# Patient Record
Sex: Male | Born: 1980 | Race: Black or African American | Hispanic: No | Marital: Single | Smoking: Current every day smoker
Health system: Southern US, Community
[De-identification: ages and names within clinical notes are randomized; demographics above are authoritative.]

## PROBLEM LIST (undated history)

## (undated) DIAGNOSIS — I10 Essential (primary) hypertension: Secondary | ICD-10-CM

---

## 2015-10-01 ENCOUNTER — Emergency Department (HOSPITAL_COMMUNITY)
Admission: EM | Admit: 2015-10-01 | Discharge: 2015-10-01 | Disposition: A | Discharge status: Home / Self Care | Payer: Self-pay | Attending: Emergency Medicine | Admitting: Emergency Medicine

## 2015-10-01 ENCOUNTER — Encounter (HOSPITAL_COMMUNITY): Payer: Self-pay

## 2015-10-01 DIAGNOSIS — Z720 Tobacco use: Secondary | ICD-10-CM | POA: Insufficient documentation

## 2015-10-01 DIAGNOSIS — I10 Essential (primary) hypertension: Secondary | ICD-10-CM | POA: Insufficient documentation

## 2015-10-01 DIAGNOSIS — G4489 Other headache syndrome: Secondary | ICD-10-CM | POA: Insufficient documentation

## 2015-10-01 DIAGNOSIS — R112 Nausea with vomiting, unspecified: Secondary | ICD-10-CM

## 2015-10-01 MED ORDER — KETOROLAC TROMETHAMINE 60 MG/2ML IM SOLN
60.0000 mg | Freq: Once | INTRAMUSCULAR | Status: AC
  Administered 2015-10-01: 60 mg via INTRAMUSCULAR
  Filled 2015-10-01: qty 2

## 2015-10-01 MED ORDER — BUTALBITAL-APAP-CAFFEINE 50-325-40 MG PO TABS: 1.0000 | ORAL_TABLET | Freq: Four times a day (QID) | ORAL | Status: AC | PRN

## 2015-10-01 NOTE — ED Notes (Signed)
Pt hollaring out loud.  Going to check on pt.  Pt states he is frustrated and tired of waiting.

## 2015-10-01 NOTE — ED Notes (Signed)
Pt reports headache and vomiting since Monday.

## 2015-10-01 NOTE — ED Notes (Signed)
Pt turned monitor off, stated it was getting on his nerves. Pt states he was tired of waiting and he was going to leave.  Explained to pt that both doctors were busy and one will be in as quick as they could. Family member states she is trying to keep him calm and wants to know if I can give him something to calm him down. Family made aware that I could give an medication without a doctors order

## 2015-10-01 NOTE — Discharge Instructions (Signed)
YOU MUST HAVE YOUR BLOOD PRESSURE RECHECKED IN NEXT 2 WEEKS  YOU MAY NEED TO START BLOOD PRESSURE MEDICATIONS IF IT STAYS ELEVATED  Please obtain all of your results from medical records or have your doctors office obtain the results - share them with your doctor - you should be seen at your doctors office in the next 2 days. Call today to arrange your follow up. Take the medications as prescribed. Please review all of the medicines and only take them if you do not have an allergy to them. Please be aware that if you are taking birth control pills, taking other prescriptions, ESPECIALLY ANTIBIOTICS may make the birth control ineffective - if this is the case, either do not engage in sexual activity or use alternative methods of birth control such as condoms until you have finished the medicine and your family doctor says it is OK to restart them. If you are on a blood thinner such as COUMADIN, be aware that any other medicine that you take may cause the coumadin to either work too much, or not enough - you should have your coumadin level rechecked in next 7 days if this is the case.  ?  It is also a possibility that you have an allergic reaction to any of the medicines that you have been prescribed - Everybody reacts differently to medications and while MOST people have no trouble with most medicines, you may have a reaction such as nausea, vomiting, rash, swelling, shortness of breath. If this is the case, please stop taking the medicine immediately and contact your physician.  ?  You should return to the ER if you develop severe or worsening symptoms.   [facility] [place] [facility]    Kari BaarsEdward Hawkins MD. Specialty: Pulmonary Disease Contact information: [address]  [address]  [place] [address]  [phone]   Syliva OvermanMargaret Simpson, MD. [facility]ty: [facility] Contact information: [address]  [place] [address]  [phone]   Lilyan PuntScott Luking, MD. Specialty:  [facility] Medicine Contact information: [address]  [address]  [place] [address]  [phone]   Avon Gullyesfaye Fanta, MD Specialty: Internal Medicine Contact information: [address]  [place] [address]  [phone]   Catalina PizzaZach Hall, MD. Specialty: Internal Medicine Contact information: [address]  [place] [address]  [phone]   Butch PennyAngus Mcinnis, MD. Specialty: Family Medicine Contact information: [address]  [place] [address]  [phone]   John GiovanniStephen Knowlton, MD. Specialty: [facility] Contact information: [address]  [address]  [place] [address]  [phone]   Carylon Perchesoy Fagan, MD. Specialty: Internal Medicine Contact information: [address]  [address]  [place] [address]  [phone]

## 2015-10-01 NOTE — ED Provider Notes (Signed)
CSN: [id number]     Arrival date & time 10/01/15  1542 History   First MD Initiated Contact with Patient 10/01/15 1550     Chief Complaint  Patient presents with  . Headache  . Emesis     (Consider location/radiation/quality/duration/timing/severity/associated sxs/prior Treatment) HPI  The patient is a 34 year old male, he has no history of headaches, no history of any significant medical problems, takes no medications. He has been having increasing headaches over the last 3 days. He recently started a new job at work, he is working with [employer] and drills. When he gets home he has a headache that is made worse with loud sounds, not affected by bright lights. It is intermittent, he states that right now the headache is not that bad. It is bitemporal, throbbing, not associated with fevers chills, shortness of breath, chest pain, stiff neck, blurred vision or any other neurologic complaints. He has had 2 episodes of nausea and vomiting today but also states that he has had some watery diarrhea today as well.  History reviewed. No pertinent past medical history. History reviewed. No pertinent past surgical history. No family history on file. Social History  Substance Use Topics  . Smoking status: Current Every Day Smoker  . Smokeless tobacco: None  . Alcohol Use: Yes     Comment: daily    Review of Systems  All other systems reviewed and are negative.     Allergies  Review of patient's allergies indicates no known allergies.  Home Medications   Prior to Admission medications   Medication Sig Start Date End Date Taking? Authorizing Provider  acetaminophen (TYLENOL) 500 MG tablet Take 500 mg by mouth every 6 (six) hours as needed for mild pain, moderate pain or headache.   Yes Historical Provider, MD  aspirin-acetaminophen-caffeine (EXCEDRIN MIGRAINE) [id number] MG tablet Take 2 tablets by mouth daily as needed for headache.   Yes Historical Provider, MD  ibuprofen  (ADVIL,MOTRIN) 200 MG tablet Take 200 mg by mouth every 6 (six) hours as needed for fever, headache or mild pain.   Yes Historical Provider, MD  butalbital-acetaminophen-caffeine (FIORICET) 50-325-40 MG tablet Take 1-2 tablets by mouth every 6 (six) hours as needed for headache. 10/01/15 09/30/16  Eber HongBrian Oluwadarasimi Redmon, MD   BP 155/108 mmHg  Pulse 96  Temp(Src) 98.1 F (36.7 C) (Oral)  Resp 18  Ht 6\' 2"  (1.88 m)  Wt 170 lb (77.111 kg)  BMI 21.82 kg/m2  SpO2 99% Physical Exam  Constitutional: He appears well-developed and well-nourished. No distress.  HENT:  Head: Normocephalic and atraumatic.  Mouth/Throat: Oropharynx is clear and moist. No oropharyngeal exudate.  Eyes: Conjunctivae and EOM are normal. Pupils are equal, round, and reactive to light. Right eye exhibits no discharge. Left eye exhibits no discharge. No scleral icterus.  Neck: Normal range of motion. Neck supple. No JVD present. No thyromegaly present.  Cardiovascular: Normal rate, regular rhythm, normal heart sounds and intact distal pulses.  Exam reveals no gallop and no friction rub.   No murmur heard. Pulmonary/Chest: Effort normal and breath sounds normal. No respiratory distress. He has no wheezes. He has no rales.  Abdominal: Soft. Bowel sounds are normal. He exhibits no distension and no mass. There is no tenderness.  Musculoskeletal: Normal range of motion. He exhibits no edema or tenderness.  Lymphadenopathy:    He has no cervical adenopathy.  Neurological: He is alert. Coordination normal.  Speech is clear, cranial nerves III through XII are intact, memory is intact, strength is  normal in all 4 extremities including grips, sensation is intact to light touch and pinprick in all 4 extremities. Coordination as tested by finger-nose-finger is normal, no limb ataxia. Normal gait, normal reflexes at the patellar tendons bilaterally  Skin: Skin is warm and dry. No rash noted. No erythema.  Psychiatric: He has a normal mood and  affect. His behavior is normal.  Nursing note and vitals reviewed.   ED Course  Procedures (including critical care time) Labs Review Labs Reviewed - No data to display  Imaging Review No results found. I have personally reviewed and evaluated these images and lab results as part of my medical decision-making.    MDM   Final diagnoses:  Other headache syndrome  Non-intractable vomiting with nausea, vomiting of unspecified type  Essential hypertension    The patient appears well, his exam is normal except for his blood pressure which is elevated. Will obtain a repeat blood pressure check after pain medications have been given. He does not need a CT scan of the brain at this time as he has had no focal neurologic deficits and seems to have a waxing and waning headache related to loud sounds. The patient is in agreement with this plan.  BP improved - needs close f/u - meds given, will avoid ibuprofen due to htn, fioricet Rx given.  Meds given in ED:  Medications  ketorolac (TORADOL) injection 60 mg (60 mg Intramuscular Given 10/01/15 1612)    New Prescriptions   BUTALBITAL-ACETAMINOPHEN-CAFFEINE (FIORICET) 50-325-40 MG TABLET    Take 1-2 tablets by mouth every 6 (six) hours as needed for headache.        Eber Hong, MD 10/01/15 1723

## 2015-12-09 ENCOUNTER — Emergency Department (HOSPITAL_COMMUNITY)
Admission: EM | Admit: 2015-12-09 | Discharge: 2015-12-09 | Disposition: A | Discharge status: Home / Self Care | Payer: Self-pay | Attending: Emergency Medicine | Admitting: Emergency Medicine

## 2015-12-09 ENCOUNTER — Emergency Department (HOSPITAL_COMMUNITY): Payer: Self-pay

## 2015-12-09 ENCOUNTER — Encounter (HOSPITAL_COMMUNITY): Payer: Self-pay | Admitting: Emergency Medicine

## 2015-12-09 DIAGNOSIS — I10 Essential (primary) hypertension: Secondary | ICD-10-CM | POA: Insufficient documentation

## 2015-12-09 DIAGNOSIS — S92301A Fracture of unspecified metatarsal bone(s), right foot, initial encounter for closed fracture: Secondary | ICD-10-CM

## 2015-12-09 DIAGNOSIS — F17200 Nicotine dependence, unspecified, uncomplicated: Secondary | ICD-10-CM | POA: Insufficient documentation

## 2015-12-09 DIAGNOSIS — Y9389 Activity, other specified: Secondary | ICD-10-CM | POA: Insufficient documentation

## 2015-12-09 DIAGNOSIS — S92334A Nondisplaced fracture of third metatarsal bone, right foot, initial encounter for closed fracture: Secondary | ICD-10-CM | POA: Insufficient documentation

## 2015-12-09 DIAGNOSIS — S92324A Nondisplaced fracture of second metatarsal bone, right foot, initial encounter for closed fracture: Secondary | ICD-10-CM | POA: Insufficient documentation

## 2015-12-09 DIAGNOSIS — W3189XA Contact with other specified machinery, initial encounter: Secondary | ICD-10-CM | POA: Insufficient documentation

## 2015-12-09 DIAGNOSIS — Y998 Other external cause status: Secondary | ICD-10-CM | POA: Insufficient documentation

## 2015-12-09 DIAGNOSIS — Y9289 Other specified places as the place of occurrence of the external cause: Secondary | ICD-10-CM | POA: Insufficient documentation

## 2015-12-09 MED ORDER — HYDROCODONE-ACETAMINOPHEN 5-325 MG PO TABS: 2.0000 | ORAL_TABLET | ORAL | Status: DC | PRN

## 2015-12-09 MED ORDER — HYDROCHLOROTHIAZIDE 25 MG PO TABS: 25.0000 mg | ORAL_TABLET | Freq: Every day | ORAL | Status: DC

## 2015-12-09 NOTE — ED Notes (Signed)
Injury to right foot with vending machine.  Rate pain 8/10.

## 2015-12-09 NOTE — ED Provider Notes (Signed)
CSN: [id number]     Arrival date & time 12/09/15  1247 History   First MD Initiated Contact with Patient 12/09/15 1331     Chief Complaint  Patient presents with  . Foot Injury    right     (Consider location/radiation/quality/duration/timing/severity/associated sxs/prior Treatment) Patient is a 35 y.o. male presenting with foot injury. The history is provided by the patient. No language interpreter was used.  Foot Injury Location:  Foot Time since incident:  2 days Injury: no   Foot location:  R foot Pain details:    Quality:  Aching   Radiates to:  Does not radiate   Duration:  2 days   Timing:  Constant   History reviewed. No pertinent past medical history. History reviewed. No pertinent past surgical history. History reviewed. No pertinent family history. Social History  Substance Use Topics  . Smoking status: Current Every Day Smoker  . Smokeless tobacco: None  . Alcohol Use: Yes     Comment: daily    Review of Systems  All other systems reviewed and are negative.     Allergies  Review of patient's allergies indicates no known allergies.  Home Medications   Prior to Admission medications   Medication Sig Start Date End Date Taking? Authorizing Provider  acetaminophen (TYLENOL) 500 MG tablet Take 500 mg by mouth every 6 (six) hours as needed for mild pain, moderate pain or headache.   Yes Historical Provider, MD  butalbital-acetaminophen-caffeine (FIORICET) 50-325-40 MG tablet Take 1-2 tablets by mouth every 6 (six) hours as needed for headache. Patient not taking: Reported on 12/09/2015 10/01/15 09/30/16  Eber Hong, MD  hydrochlorothiazide (HYDRODIURIL) 25 MG tablet Take 1 tablet (25 mg total) by mouth daily. 12/09/15   Elson Areas, PA-C  HYDROcodone-acetaminophen (NORCO/VICODIN) 5-325 MG tablet Take 2 tablets by mouth every 4 (four) hours as needed. 12/09/15   Lonia Skinner Sofia, PA-C   BP 162/109 mmHg  Pulse 101  Temp(Src) 98.1 F (36.7 C) (Oral)  Resp 16   Ht  (1.88 m)  Wt 72.576 kg  BMI 20.53 kg/m2  SpO2 97% Physical Exam  Constitutional: He is oriented to person, place, and time. He appears well-developed and well-nourished.  HENT:  Head: Normocephalic and atraumatic.  Cardiovascular: Normal rate.   Pulmonary/Chest: Effort normal.  Musculoskeletal: He exhibits tenderness.  Swollen tender right foot, pain to palpation,  nv and ns intact  Neurological: He is alert and oriented to person, place, and time. He has normal reflexes.  Skin: Skin is warm.  Psychiatric: He has a normal mood and affect.  Nursing note and vitals reviewed.   ED Course  Procedures (including critical care time) Labs Review Labs Reviewed - No data to display  Imaging Review Dg Foot Complete Right  12/09/2015  CLINICAL DATA:  Pain after trauma EXAM: RIGHT FOOT COMPLETE - 3+ VIEW COMPARISON:  None. FINDINGS: There is a fracture through the second metatarsal diaphysis without displacement. There is also a nondisplaced fracture through the base of the third metatarsal. No other acute abnormalities. IMPRESSION: Fractures through the second and third metatarsals as described above without displacement. Electronically Signed   By: Gerome Sam III M.D   On: 12/09/2015 13:24   I have personally reviewed and evaluated these images and lab results as part of my medical decision-making.   EKG Interpretation None      MDM   Final diagnoses:  Essential hypertension  Multiple closed fractures of metatarsal bone of right foot, initial  encounter    Walker boot Hydrocodone Follow up with Dr. Ophelia Charter for recheck.     Lonia Skinner [place], PA-C 12/09/15 1628  Raeford Razor, MD 12/15/15 [phone]

## 2015-12-09 NOTE — Discharge Instructions (Signed)
DASH Eating Plan °DASH stands for "Dietary Approaches to Stop Hypertension." The DASH eating plan is a healthy eating plan that has been shown to reduce high blood pressure (hypertension). Additional health benefits may include reducing the risk of type 2 diabetes mellitus, heart disease, and stroke. The DASH eating plan may also help with weight loss. °WHAT DO I NEED TO KNOW ABOUT THE DASH EATING PLAN? °For the DASH eating plan, you will follow these general guidelines: °· Choose foods with a percent daily value for sodium of less than 5% (as listed on the food label). °· Use salt-free seasonings or herbs instead of table salt or sea salt. °· Check with your health care provider or pharmacist before using salt substitutes. °· Eat lower-sodium products, often labeled as "lower sodium" or "no salt added." °· Eat fresh foods. °· Eat more vegetables, fruits, and low-fat dairy products. °· Choose whole grains. Look for the word "whole" as the first word in the ingredient list. °· Choose fish and skinless chicken or turkey more often than red meat. Limit fish, poultry, and meat to 6 oz (170 g) each day. °· Limit sweets, desserts, sugars, and sugary drinks. °· Choose heart-healthy fats. °· Limit cheese to 1 oz (28 g) per day. °· Eat more home-cooked food and less restaurant, buffet, and fast food. °· Limit fried foods. °· Cook foods using methods other than frying. °· Limit canned vegetables. If you do use them, rinse them well to decrease the sodium. °· When eating at a restaurant, ask that your food be prepared with less salt, or no salt if possible. °WHAT FOODS CAN I EAT? °Seek help from a dietitian for individual calorie needs. °Grains °Whole grain or whole wheat bread. Brown rice. Whole grain or whole wheat pasta. Quinoa, bulgur, and whole grain cereals. Low-sodium cereals. Corn or whole wheat flour tortillas. Whole grain cornbread. Whole grain crackers. Low-sodium crackers. °Vegetables °Fresh or frozen vegetables  (raw, steamed, roasted, or grilled). Low-sodium or reduced-sodium tomato and vegetable juices. Low-sodium or reduced-sodium tomato sauce and paste. Low-sodium or reduced-sodium canned vegetables.  °Fruits °All fresh, canned (in natural juice), or frozen fruits. °Meat and Other Protein Products °Ground beef (85% or leaner), grass-fed beef, or beef trimmed of fat. Skinless chicken or turkey. Ground chicken or turkey. Pork trimmed of fat. All fish and seafood. Eggs. Dried beans, peas, or lentils. Unsalted nuts and seeds. Unsalted canned beans. °Dairy °Low-fat dairy products, such as skim or 1% milk, 2% or reduced-fat cheeses, low-fat ricotta or cottage cheese, or plain low-fat yogurt. Low-sodium or reduced-sodium cheeses. °Fats and Oils °Tub margarines without trans fats. Light or reduced-fat mayonnaise and salad dressings (reduced sodium). Avocado. Safflower, olive, or canola oils. Natural peanut or almond butter. °Other °Unsalted popcorn and pretzels. °The items listed above may not be a complete list of recommended foods or beverages. Contact your dietitian for more options. °WHAT FOODS ARE NOT RECOMMENDED? °Grains °White bread. White pasta. White rice. Refined cornbread. Bagels and croissants. Crackers that contain trans fat. °Vegetables °Creamed or fried vegetables. Vegetables in a cheese sauce. Regular canned vegetables. Regular canned tomato sauce and paste. Regular tomato and vegetable juices. °Fruits °Dried fruits. Canned fruit in light or heavy syrup. Fruit juice. °Meat and Other Protein Products °Fatty cuts of meat. Ribs, chicken wings, bacon, sausage, bologna, salami, chitterlings, fatback, hot dogs, bratwurst, and packaged luncheon meats. Salted nuts and seeds. Canned beans with salt. °Dairy °Whole or 2% milk, cream, half-and-half, and cream cheese. Whole-fat or sweetened yogurt. Full-fat   cheeses or blue cheese. Nondairy creamers and whipped toppings. Processed cheese, cheese spreads, or cheese  curds. Condiments Onion and garlic salt, seasoned salt, table salt, and sea salt. Canned and packaged gravies. Worcestershire sauce. Tartar sauce. Barbecue sauce. Teriyaki sauce. Soy sauce, including reduced sodium. Steak sauce. Fish sauce. Oyster sauce. Cocktail sauce. Horseradish. Ketchup and mustard. Meat flavorings and tenderizers. Bouillon cubes. Hot sauce. Tabasco sauce. Marinades. Taco seasonings. Relishes. Fats and Oils Butter, stick margarine, lard, shortening, ghee, and bacon fat. Coconut, palm kernel, or palm oils. Regular salad dressings. Other Pickles and olives. Salted popcorn and pretzels. The items listed above may not be a complete list of foods and beverages to avoid. Contact your dietitian for more information. WHERE CAN I FIND MORE INFORMATION? National Heart, Lung, and Blood Institute: travelstabloid.com   This information is not intended to replace advice given to you by your health care provider. Make sure you discuss any questions you have with your health care provider.   Document Released: 10/28/2011 Document Revised: 11/29/2014 Document Reviewed: 09/12/2013 Elsevier Interactive Patient Education 2016 [employer]. Hypertension Hypertension, commonly called high blood pressure, is when the force of blood pumping through your arteries is too strong. Your arteries are the blood vessels that carry blood from your heart throughout your body. A blood pressure reading consists of a higher number over a lower number, such as 110/72. The higher number (systolic) is the pressure inside your arteries when your heart pumps. The lower number (diastolic) is the pressure inside your arteries when your heart relaxes. Ideally you want your blood pressure below 120/80. Hypertension forces your heart to work harder to pump blood. Your arteries may become narrow or stiff. Having untreated or uncontrolled hypertension can cause heart attack, stroke, kidney  disease, and other problems. RISK FACTORS Some risk factors for high blood pressure are controllable. Others are not.  Risk factors you cannot control include:   Race. You may be at higher risk if you are African American.  Age. Risk increases with age.  Gender. Men are at higher risk than women before age 60 years. After age 53, women are at higher risk than men. Risk factors you can control include:  Not getting enough exercise or physical activity.  Being overweight.  Getting too much fat, sugar, calories, or salt in your diet.  Drinking too much alcohol. SIGNS AND SYMPTOMS Hypertension does not usually cause signs or symptoms. Extremely high blood pressure (hypertensive crisis) may cause headache, anxiety, shortness of breath, and nosebleed. DIAGNOSIS To check if you have hypertension, your health care provider will measure your blood pressure while you are seated, with your arm held at the level of your heart. It should be measured at least twice using the same arm. Certain conditions can cause a difference in blood pressure between your right and left arms. A blood pressure reading that is higher than normal on one occasion does not mean that you need treatment. If it is not clear whether you have high blood pressure, you may be asked to return on a different day to have your blood pressure checked again. Or, you may be asked to monitor your blood pressure at home for 1 or more weeks. TREATMENT Treating high blood pressure includes making lifestyle changes and possibly taking medicine. Living a healthy lifestyle can help lower high blood pressure. You may need to change some of your habits. Lifestyle changes may include:  Following the DASH diet. This diet is high in fruits, vegetables, and whole grains.  It is low in salt, red meat, and added sugars.  Keep your sodium intake below 2,300 mg per day.  Getting at least 30-45 minutes of aerobic exercise at least 4 times per  week.  Losing weight if necessary.  Not smoking.  Limiting alcoholic beverages.  Learning ways to reduce stress. Your health care provider may prescribe medicine if lifestyle changes are not enough to get your blood pressure under control, and if one of the following is true:  You are 24-1 years of age and your systolic blood pressure is above 140.  You are 71 years of age or older, and your systolic blood pressure is above 150.  Your diastolic blood pressure is above 90.  You have diabetes, and your systolic blood pressure is over 140 or your diastolic blood pressure is over 90.  You have kidney disease and your blood pressure is above 140/90.  You have heart disease and your blood pressure is above 140/90. Your personal target blood pressure may vary depending on your medical conditions, your age, and other factors. HOME CARE INSTRUCTIONS  Have your blood pressure rechecked as directed by your health care provider.   Take medicines only as directed by your health care provider. Follow the directions carefully. Blood pressure medicines must be taken as prescribed. The medicine does not work as well when you skip doses. Skipping doses also puts you at risk for problems.  Do not smoke.   Monitor your blood pressure at home as directed by your health care provider. SEEK MEDICAL CARE IF:   You think you are having a reaction to medicines taken.  You have recurrent headaches or feel dizzy.  You have swelling in your ankles.  You have trouble with your vision. SEEK IMMEDIATE MEDICAL CARE IF:  You develop a severe headache or confusion.  You have unusual weakness, numbness, or feel faint.  You have severe chest or abdominal pain.  You vomit repeatedly.  You have trouble breathing. MAKE SURE YOU:   Understand these instructions.  Will watch your condition.  Will get help right away if you are not doing well or get worse.   This information is not intended to  replace advice given to you by your health care provider. Make sure you discuss any questions you have with your health care provider.   Document Released: 11/08/2005 Document Revised: 03/25/2015 Document Reviewed: 08/31/2013 Elsevier Interactive Patient Education 2016 [employer] Metatarsal Fracture A metatarsal fracture is a break in a metatarsal bone. Metatarsal bones connect your toe bones to your ankle bones. CAUSES This type of fracture may be caused by:  A sudden twisting of your foot.  A fall onto your foot.  Overuse or repetitive exercise. RISK FACTORS This condition is more likely to develop in people who:  Play contact sports.  Have a bone disease.  Have a low calcium level. SYMPTOMS Symptoms of this condition include:  Pain that is worse when walking or standing.  Pain when pressing on the foot or moving the toes.  Swelling.  Bruising on the top or bottom of the foot.  A foot that appears shorter than the other one. DIAGNOSIS This condition is diagnosed with a physical exam. You may also have imaging tests, such as:  X-rays.  A CT scan.  MRI. TREATMENT Treatment for this condition depends on its severity and whether a bone has moved out of place. Treatment may involve:  Rest.  Wearing foot support such as a cast, splint, or boot  for several weeks.  Using crutches.  Surgery to move bones back into the right position. Surgery is usually needed if there are many pieces of broken bone or bones that are very out of place (displaced fracture).  Physical therapy. This may be needed to help you regain full movement and strength in your foot. You will need to return to your health care provider to have X-rays taken until your bones heal. Your health care provider will look at the X-rays to make sure that your foot is healing well. HOME CARE INSTRUCTIONS  If You Have a Cast:  Do not stick anything inside the cast to scratch your skin. Doing that  increases your risk of infection.  Check the skin around the cast every day. Report any concerns to your health care provider. You may put lotion on dry skin around the edges of the cast. Do not apply lotion to the skin underneath the cast.  Keep the cast clean and dry. If You Have a Splint or a Supportive Boot:  Wear it as directed by your health care provider. Remove it only as directed by your health care provider.  Loosen it if your toes become numb and tingle, or if they turn cold and blue.  Keep it clean and dry. Bathing  Do not take baths, swim, or use a hot tub until your health care provider approves. Ask your health care provider if you can take showers. You may only be allowed to take sponge baths for bathing.  If your health care provider approves bathing and showering, cover the cast or splint with a watertight plastic bag to protect it from water. Do not let the cast or splint get wet. Managing Pain, Stiffness, and Swelling  If directed, apply ice to the injured area (if you have a splint, not a cast).  Put ice in a plastic bag.  Place a towel between your skin and the bag.  Leave the ice on for 20 minutes, 2-3 times per day.  Move your toes often to avoid stiffness and to lessen swelling.  Raise (elevate) the injured area above the level of your heart while you are sitting or lying down. Driving  Do not drive or operate heavy machinery while taking pain medicine.  Do not drive while wearing foot support on a foot that you use for driving. Activity  Return to your normal activities as directed by your health care provider. Ask your health care provider what activities are safe for you.  Perform exercises as directed by your health care provider or physical therapist. Safety  Do not use the injured foot to support your body weight until your health care provider says that you can. Use crutches as directed by your health care provider. General Instructions  Do  not put pressure on any part of the cast or splint until it is fully hardened. This may take several hours.  Do not use any tobacco products, including cigarettes, chewing tobacco, or e-cigarettes. Tobacco can delay bone healing. If you need help quitting, ask your health care provider.  Take medicines only as directed by your health care provider.  Keep all follow-up visits as directed by your health care provider. This is important. SEEK MEDICAL CARE IF:  You have a fever.  Your cast, splint, or boot is too loose or too tight.  Your cast, splint, or boot is damaged.  Your pain medicine is not helping.  You have pain, tingling, or numbness in your foot that  is not going away. SEEK IMMEDIATE MEDICAL CARE IF:  You have severe pain.  You have tingling or numbness in your foot that is getting worse.  Your foot feels cold or becomes numb.  Your foot changes color.   This information is not intended to replace advice given to you by your health care provider. Make sure you discuss any questions you have with your health care provider.   Document Released: 07/31/2002 Document Revised: 03/25/2015 Document Reviewed: 09/04/2014 Elsevier Interactive Patient Education [employer]

## 2017-02-11 ENCOUNTER — Encounter (HOSPITAL_COMMUNITY): Payer: Self-pay | Admitting: Emergency Medicine

## 2017-02-11 ENCOUNTER — Emergency Department (HOSPITAL_COMMUNITY)
Admission: EM | Admit: 2017-02-11 | Discharge: 2017-02-11 | Disposition: A | Discharge status: Home / Self Care | Payer: Self-pay | Attending: Emergency Medicine | Admitting: Emergency Medicine

## 2017-02-11 DIAGNOSIS — F17200 Nicotine dependence, unspecified, uncomplicated: Secondary | ICD-10-CM | POA: Insufficient documentation

## 2017-02-11 DIAGNOSIS — S61412A Laceration without foreign body of left hand, initial encounter: Secondary | ICD-10-CM | POA: Insufficient documentation

## 2017-02-11 DIAGNOSIS — Z23 Encounter for immunization: Secondary | ICD-10-CM | POA: Insufficient documentation

## 2017-02-11 DIAGNOSIS — Y92000 Kitchen of unspecified non-institutional (private) residence as  the place of occurrence of the external cause: Secondary | ICD-10-CM | POA: Insufficient documentation

## 2017-02-11 DIAGNOSIS — Y93G3 Activity, cooking and baking: Secondary | ICD-10-CM | POA: Insufficient documentation

## 2017-02-11 DIAGNOSIS — Y999 Unspecified external cause status: Secondary | ICD-10-CM | POA: Insufficient documentation

## 2017-02-11 DIAGNOSIS — W260XXA Contact with knife, initial encounter: Secondary | ICD-10-CM | POA: Insufficient documentation

## 2017-02-11 DIAGNOSIS — I10 Essential (primary) hypertension: Secondary | ICD-10-CM | POA: Insufficient documentation

## 2017-02-11 HISTORY — DX: Essential (primary) hypertension: I10

## 2017-02-11 MED ORDER — LIDOCAINE HCL (PF) 1 % IJ SOLN
INTRAMUSCULAR | Status: AC
  Administered 2017-02-11: 22:00:00
  Filled 2017-02-11: qty 10

## 2017-02-11 MED ORDER — IBUPROFEN 800 MG PO TABS
800.0000 mg | ORAL_TABLET | Freq: Once | ORAL | Status: AC
  Administered 2017-02-11: 800 mg via ORAL
  Filled 2017-02-11: qty 1

## 2017-02-11 MED ORDER — TETANUS-DIPHTH-ACELL PERTUSSIS 5-2.5-18.5 LF-MCG/0.5 IM SUSP
0.5000 mL | Freq: Once | INTRAMUSCULAR | Status: AC
  Administered 2017-02-11: 0.5 mL via INTRAMUSCULAR
  Filled 2017-02-11: qty 0.5

## 2017-02-11 MED ORDER — ACETAMINOPHEN 325 MG PO TABS
650.0000 mg | ORAL_TABLET | Freq: Once | ORAL | Status: AC
  Administered 2017-02-11: 650 mg via ORAL
  Filled 2017-02-11: qty 2

## 2017-02-11 NOTE — Discharge Instructions (Signed)
Please keep your left hand clean and dry. Change dressing daily. Use a bag and a twist tie or a rubber band if you get in shower. Please have your sutures taken out in 7 days. See your doctor or return to the department if any signs of infection. Use Tylenol and ibuprofen for soreness.

## 2017-02-11 NOTE — ED Triage Notes (Signed)
Slammed glass down on table and it broke 3 hours ago Now with cut to palm of left hand   No physician   No DT is recent past

## 2017-02-11 NOTE — ED Triage Notes (Signed)
Cut is 1 1/2 inches with most of cut to thenar/palmar surface Wound dressed with 4x4 and kling

## 2017-02-11 NOTE — ED Provider Notes (Signed)
[facility]-EMER[facility]ENCY DEPT Provider Note   CSN: [id number] Arrival date & time: 02/11/17  2058     History   Chief Complaint Chief Complaint  Patient presents with  . Laceration    HPI Jeremy Berger is a 36 y.o. male.  Patient is a 36 year old male who presents to the emergency department with a laceration to the left hand.  The patient states that he is been drinking, he was cooking, neck cut his left hand. The patient is unsure of the date of his last tetanus shot. The patient states he has good movement and good feeling of all the fingers of his left hand. He denies being on any anticoagulation medications. He has not had any previous operations or procedure involving the left upper extremity.      Past Medical History:  Diagnosis Date  . Hypertension     There are no active problems to display for this patient.   History reviewed. No pertinent surgical history.     Home Medications    Prior to Admission medications   Medication Sig Start Date End Date Taking? Authorizing Provider  acetaminophen (TYLENOL) 500 MG tablet Take 500 mg by mouth every 6 (six) hours as needed for mild pain, moderate pain or headache.    Historical Provider, MD  hydrochlorothiazide (HYDRODIURIL) 25 MG tablet Take 1 tablet (25 mg total) by mouth daily. 12/09/15   Elson AreasLeslie K Sofia, PA-C  HYDROcodone-acetaminophen (NORCO/VICODIN) 5-325 MG tablet Take 2 tablets by mouth every 4 (four) hours as needed. 12/09/15   Elson AreasLeslie K Sofia, PA-C    Family History No family history on file.  Social History Social History  Substance Use Topics  . Smoking status: Current Every Day Smoker  . Smokeless tobacco: Never Used  . Alcohol use Yes     Comment: daily     Allergies   Patient has no known allergies.   Review of Systems Review of Systems  Constitutional: Negative for activity change.       All ROS Neg except as noted in HPI  HENT: Negative for nosebleeds.   Eyes: Negative for photophobia and  discharge.  Respiratory: Negative for cough, shortness of breath and wheezing.   Cardiovascular: Negative for chest pain and palpitations.  Gastrointestinal: Negative for abdominal pain and blood in stool.  Genitourinary: Negative for dysuria, frequency and hematuria.  Musculoskeletal: Negative for arthralgias, back pain and neck pain.  Skin: Negative.   Neurological: Negative for dizziness, seizures and speech difficulty.  Psychiatric/Behavioral: Negative for confusion and hallucinations.     Physical Exam Updated Vital Signs BP (!) 139/97 (BP Location: Right Arm)   Pulse 100   Temp 98.1 F (36.7 C) (Oral)   Ht 6\' 2"  (1.88 m)   Wt 72.6 kg   SpO2 100%   BMI 20.54 kg/m   Physical Exam  Musculoskeletal:       Hands: No bone or tendon involvement. No fb in the laceration.  Neurological:  No motor or sensory deficits noted of the left upper extremity.     ED Treatments / Results  Labs (all labs ordered are listed, but only abnormal results are displayed) Labs Reviewed - No data to display  EKG  EKG Interpretation None       Radiology No results found.  Procedures .Marland Kitchen.Laceration Repair Date/Time: 02/11/2017 10:14 PM Performed by: Ivery QualeBRYANT, Sacheen Arrasmith Authorized by: Ivery QualeBRYANT, Kaliyah Gladman   Consent:    Consent obtained:  Verbal   Consent given by:  Patient   Risks discussed:  Infection, pain and poor cosmetic result Anesthesia (see MAR for exact dosages):    Anesthesia method:  Local infiltration   Local anesthetic:  Lidocaine 1% w/o epi Laceration details:    Location:  Hand   Hand location:  L palm   Length (cm):  4.3 Repair type:    Repair type:  Simple Pre-procedure details:    Preparation:  Patient was prepped and draped in usual sterile fashion Exploration:    Hemostasis achieved with:  Direct pressure   Wound exploration: wound explored through full range of motion     Wound extent: no nerve damage noted, no tendon damage noted and no vascular damage noted      Contaminated: no   Treatment:    Area cleansed with:  Betadine   Amount of cleaning:  Standard   Irrigation solution:  Sterile saline Skin repair:    Repair method:  Sutures   Suture size:  4-0   Suture material:  Nylon   Suture technique:  Simple interrupted   Number of sutures:  8 Approximation:    Approximation:  Close Post-procedure details:    Dressing:  Sterile dressing   Patient tolerance of procedure:  Tolerated well, no immediate complications   (including critical care time)  Medications Ordered in ED Medications  lidocaine (PF) (XYLOCAINE) 1 % injection (  Given by Other 02/11/17 2154)     Initial Impression / Assessment and Plan / ED Course  I have reviewed the triage vital signs and the nursing notes.  Pertinent labs & imaging results that were available during my care of the patient were reviewed by me and considered in my medical decision making (see chart for details).     **I have reviewed nursing notes, vital signs, and all appropriate lab and imaging results for this patient.*  Final Clinical Impressions(s) / ED Diagnoses MDM Vital signs are found to be within normal limits with exception of the blood pressure being 139/97. The patient's tetanus status was updated. The laceration to the palmar surface of the left hand measures 4.3 cm and will and was repaired with 8 interrupted sutures of 4-0 nylon. We discussed the importance of keeping the wound clean and dry, changing dressings daily. The patient will have the sutures removed in about 7 days. Questions were answered. Patient is in agreement with this plan.    Final diagnoses:  None    New Prescriptions New Prescriptions   No medications on file     Ivery Quale, Cordelia Poche 02/11/17 2221    Bethann Berkshire, MD 02/11/17 [phone]

## 2017-02-11 NOTE — ED Triage Notes (Signed)
Pt states he was cooking and cut the palm of his L. Hand with a knife. Hand bandaged upon arrival d/t continuous bleeding x 3 hrs per pt.

## 2017-02-19 ENCOUNTER — Encounter (HOSPITAL_COMMUNITY): Payer: Self-pay | Admitting: *Deleted

## 2017-02-19 ENCOUNTER — Emergency Department (HOSPITAL_COMMUNITY)
Admission: EM | Admit: 2017-02-19 | Discharge: 2017-02-19 | Disposition: A | Discharge status: Home / Self Care | Payer: Self-pay | Attending: Emergency Medicine | Admitting: Emergency Medicine

## 2017-02-19 DIAGNOSIS — Z4802 Encounter for removal of sutures: Secondary | ICD-10-CM | POA: Insufficient documentation

## 2017-02-19 DIAGNOSIS — I10 Essential (primary) hypertension: Secondary | ICD-10-CM | POA: Insufficient documentation

## 2017-02-19 DIAGNOSIS — F17200 Nicotine dependence, unspecified, uncomplicated: Secondary | ICD-10-CM | POA: Insufficient documentation

## 2017-02-19 MED ORDER — HYDROCHLOROTHIAZIDE 25 MG PO TABS: 25.0000 mg | ORAL_TABLET | Freq: Every day | ORAL | 1 refills | Status: DC

## 2017-02-19 MED ORDER — AMLODIPINE BESYLATE 5 MG PO TABS: 5.0000 mg | ORAL_TABLET | Freq: Every day | ORAL | 1 refills | Status: DC

## 2017-02-19 NOTE — ED Triage Notes (Signed)
Pt comes in for suture removal. They are located on his left hand. BP is 171/129. Pt is suppose to take BP medication but isn't.

## 2017-02-19 NOTE — ED Provider Notes (Addendum)
[facility]-EMERGENCY DEPT Provider Note   CSN: [id number] Arrival date & time: 02/19/17  0724     History   Chief Complaint Chief Complaint  Patient presents with  . Suture / Staple Removal    HPI Jeremy Berger is a 36 y.o. male.  Patient presents with need for suture removal from left hand. Additionally, he has known hypertension and has run out of his medication. No primary care doctor. He has no other somatic complaints      Past Medical History:  Diagnosis Date  . Hypertension     There are no active problems to display for this patient.   History reviewed. No pertinent surgical history.     Home Medications    Prior to Admission medications   Medication Sig Start Date End Date Taking? Authorizing Provider  acetaminophen (TYLENOL) 500 MG tablet Take 500 mg by mouth every 6 (six) hours as needed for mild pain, moderate pain or headache.    Historical Provider, MD  amLODipine (NORVASC) 5 MG tablet Take 1 tablet (5 mg total) by mouth daily. 02/19/17   Donnetta Hutching, MD  hydrochlorothiazide (HYDRODIURIL) 25 MG tablet Take 1 tablet (25 mg total) by mouth daily. 02/19/17   Donnetta Hutching, MD  HYDROcodone-acetaminophen (NORCO/VICODIN) 5-325 MG tablet Take 2 tablets by mouth every 4 (four) hours as needed. 12/09/15   Elson Areas, PA-C    Family History No family history on file.  Social History Social History  Substance Use Topics  . Smoking status: Current Every Day Smoker  . Smokeless tobacco: Never Used  . Alcohol use Yes     Comment: daily     Allergies   Patient has no known allergies.   Review of Systems Review of Systems  All other systems reviewed and are negative.    Physical Exam Updated Vital Signs BP (!) 171/129 (BP Location: Left Arm)   Pulse 86   Temp 97.8 F (36.6 C) (Oral)   Resp 18   Ht  (1.88 m)   Wt 160 lb (72.6 kg)   SpO2 99%   BMI 20.54 kg/m   Physical Exam  Constitutional: He appears well-developed and well-nourished.    Blood pressure elevated  Skin:  Laceration on the palm of left hand is healing well.     ED Treatments / Results  Labs (all labs ordered are listed, but only abnormal results are displayed) Labs Reviewed - No data to display  EKG  EKG Interpretation None       Radiology No results found.  Procedures Procedures (including critical care time)  Medications Ordered in ED Medications - No data to display   Initial Impression / Assessment and Plan / ED Course  I have reviewed the triage vital signs and the nursing notes.  Pertinent labs & imaging results that were available during my care of the patient were reviewed by me and considered in my medical decision making (see chart for details).     Suture removal by examiner. Steri-Strips applied. Will restart patient on hydrochlorothiazide 25 mg daily. He will follow-up with the primary clear clinic in [place]  Final Clinical Impressions(s) / ED Diagnoses   Final diagnoses:  Encounter for removal of sutures  Hypertension, unspecified type    New Prescriptions New Prescriptions   AMLODIPINE (NORVASC) 5 MG TABLET    Take 1 tablet (5 mg total) by mouth daily.   HYDROCHLOROTHIAZIDE (HYDRODIURIL) 25 MG TABLET    Take 1 tablet (25 mg total) by mouth  daily.     Donnetta Hutching, MD 02/19/17 0272    Donnetta Hutching, MD 02/19/17 [phone]

## 2017-02-19 NOTE — Discharge Instructions (Signed)
Keep wound clean and dry. Prescription for blood pressure medication. You must get primary care follow-up for blood pressure. Please give patient information for local clinic

## 2017-12-05 ENCOUNTER — Encounter (HOSPITAL_COMMUNITY): Payer: Self-pay

## 2017-12-05 ENCOUNTER — Emergency Department (HOSPITAL_COMMUNITY)
Admission: EM | Admit: 2017-12-05 | Discharge: 2017-12-05 | Disposition: A | Discharge status: Home / Self Care | Payer: Self-pay | Attending: Emergency Medicine | Admitting: Emergency Medicine

## 2017-12-05 ENCOUNTER — Emergency Department (HOSPITAL_COMMUNITY): Payer: Self-pay

## 2017-12-05 DIAGNOSIS — I10 Essential (primary) hypertension: Secondary | ICD-10-CM | POA: Insufficient documentation

## 2017-12-05 DIAGNOSIS — F17210 Nicotine dependence, cigarettes, uncomplicated: Secondary | ICD-10-CM | POA: Insufficient documentation

## 2017-12-05 DIAGNOSIS — R55 Syncope and collapse: Secondary | ICD-10-CM | POA: Insufficient documentation

## 2017-12-05 LAB — CBC WITH DIFFERENTIAL/PLATELET
BASOS ABS: 0 10*3/uL (ref 0.0–0.1)
BASOS PCT: 0 %
EOS ABS: 0 10*3/uL (ref 0.0–0.7)
Eosinophils Relative: 0 %
HEMATOCRIT: 44.6 % (ref 39.0–52.0)
Hemoglobin: 15.4 g/dL (ref 13.0–17.0)
Lymphocytes Relative: 11 %
Lymphs Abs: 0.7 10*3/uL (ref 0.7–4.0)
MCH: 34.5 pg — ABNORMAL HIGH (ref 26.0–34.0)
MCHC: 34.5 g/dL (ref 30.0–36.0)
MCV: 100 fL (ref 78.0–100.0)
MONO ABS: 0.7 10*3/uL (ref 0.1–1.0)
Monocytes Relative: 12 %
NEUTROS ABS: 4.6 10*3/uL (ref 1.7–7.7)
Neutrophils Relative %: 77 %
Platelets: 115 10*3/uL — ABNORMAL LOW (ref 150–400)
RBC: 4.46 MIL/uL (ref 4.22–5.81)
RDW: 13.3 % (ref 11.5–15.5)
WBC: 6 10*3/uL (ref 4.0–10.5)

## 2017-12-05 LAB — BASIC METABOLIC PANEL
ANION GAP: 15 (ref 5–15)
BUN: 14 mg/dL (ref 6–20)
CALCIUM: 10.4 mg/dL — AB (ref 8.9–10.3)
CO2: 26 mmol/L (ref 22–32)
CREATININE: 0.92 mg/dL (ref 0.61–1.24)
Chloride: 93 mmol/L — ABNORMAL LOW (ref 101–111)
Glucose, Bld: 147 mg/dL — ABNORMAL HIGH (ref 65–99)
Potassium: 4.3 mmol/L (ref 3.5–5.1)
SODIUM: 134 mmol/L — AB (ref 135–145)

## 2017-12-05 LAB — RAPID URINE DRUG SCREEN, HOSP PERFORMED
Amphetamines: NOT DETECTED
BARBITURATES: NOT DETECTED
Benzodiazepines: NOT DETECTED
Cocaine: NOT DETECTED
Opiates: NOT DETECTED
TETRAHYDROCANNABINOL: POSITIVE — AB

## 2017-12-05 LAB — URINALYSIS, ROUTINE W REFLEX MICROSCOPIC
Glucose, UA: NEGATIVE mg/dL
Hgb urine dipstick: NEGATIVE
KETONES UR: 15 mg/dL — AB
LEUKOCYTES UA: NEGATIVE
NITRITE: NEGATIVE
PROTEIN: 100 mg/dL — AB
Specific Gravity, Urine: 1.025 (ref 1.005–1.030)
pH: 7 (ref 5.0–8.0)

## 2017-12-05 LAB — URINALYSIS, MICROSCOPIC (REFLEX)
SQUAMOUS EPITHELIAL / LPF: NONE SEEN
WBC UA: NONE SEEN WBC/hpf (ref 0–5)

## 2017-12-05 LAB — ETHANOL: Alcohol, Ethyl (B): <10 mg/dL (ref <10)

## 2017-12-05 MED ORDER — SODIUM CHLORIDE 0.9 % IV BOLUS (SEPSIS)
1000.0000 mL | Freq: Once | INTRAVENOUS | Status: AC
  Administered 2017-12-05: 1000 mL via INTRAVENOUS

## 2017-12-05 MED ORDER — CLONIDINE HCL 0.2 MG PO TABS
0.2000 mg | ORAL_TABLET | Freq: Once | ORAL | Status: AC
  Administered 2017-12-05: 0.2 mg via ORAL
  Filled 2017-12-05: qty 1

## 2017-12-05 MED ORDER — HYDROCHLOROTHIAZIDE 25 MG PO TABS: 25.0000 mg | ORAL_TABLET | Freq: Every day | ORAL | 1 refills | Status: AC

## 2017-12-05 NOTE — ED Notes (Signed)
Patient to radiology.

## 2017-12-05 NOTE — Discharge Instructions (Signed)
Tests showed no life-threatening condition.  We will start a blood pressure medication.  Recommend initiating a blood pressure log.  Avoid street drugs and alcohol.  Follow-up at the free clinic.

## 2017-12-05 NOTE — ED Triage Notes (Signed)
Ems reports girlfriend called because pt had seizure like activity and passed out.  EMS arrived and found pt diaphoretic but alert and oriented.   No sign of a seizure.  No history of seizures.  BP was 181/123.  cbg 198.  Pt reports he drinks etoh daily.  Last etoh was yesterday.  Also reports marijuana use.

## 2017-12-05 NOTE — ED Provider Notes (Addendum)
[facility] EMERGENCY DEPARTMENT Provider Note   CSN: [id number] Arrival date & time: 12/05/17  0740     History   Chief Complaint Chief Complaint  Patient presents with  . Loss of Consciousness    HPI Jeremy Berger is a 37 y.o. male.  Level 5 caveat for amnesia to event.  Most of history obtained from girlfriend.  She reports a seizure-like activity earlier this morning and then "passing out".  EMS reports no postictal behavior, but he was diaphoretic at the scene.  No history of seizures.  Family reports history of hypertension.  He drinks alcohol daily and smokes marijuana.  No prodromal illnesses.  No fever, sweats, chills, gross neurological deficits.      Past Medical History:  Diagnosis Date  . Hypertension     There are no active problems to display for this patient.   History reviewed. No pertinent surgical history.     Home Medications    Prior to Admission medications   Medication Sig Start Date End Date Taking? Authorizing Provider  naproxen (NAPROSYN) 250 MG tablet Take 250 mg by mouth 2 (two) times daily as needed for mild pain.   Yes [provider]  hydrochlorothiazide (HYDRODIURIL) 25 MG tablet Take 1 tablet (25 mg total) by mouth daily. 12/05/17   Donnetta Hutching, MD    Family History No family history on file.  Social History Social History   Tobacco Use  . Smoking status: Current Every Day Smoker  . Smokeless tobacco: Never Used  Substance Use Topics  . Alcohol use: Yes    Comment: daily  . Drug use: Yes    Types: Marijuana     Allergies   Patient has no known allergies.   Review of Systems Review of Systems  Unable to perform ROS: Acuity of condition     Physical Exam Updated Vital Signs BP (!) 146/116   Pulse 84   Temp 98.7 F (37.1 C) (Oral)   Resp 18   Ht 6\' 2"  (1.88 m)   Wt 72.6 kg (160 lb)   SpO2 97%   BMI 20.54 kg/m   Physical Exam  Constitutional: He is oriented to person, place, and time. He appears  well-developed and well-nourished.  Blood pressure elevated; no acute distress; no evidence of postictal behavior  HENT:  Head: Normocephalic and atraumatic.  Eyes: Conjunctivae are normal.  Neck: Neck supple.  Cardiovascular: Normal rate and regular rhythm.  Pulmonary/Chest: Effort normal and breath sounds normal.  Abdominal: Soft. Bowel sounds are normal.  Musculoskeletal: Normal range of motion.  Neurological: He is alert and oriented to person, place, and time.  Skin: Skin is warm and dry.  Psychiatric: He has a normal mood and affect. His behavior is normal.  Nursing note and vitals reviewed.    ED Treatments / Results  Labs (all labs ordered are listed, but only abnormal results are displayed) Labs Reviewed  CBC WITH DIFFERENTIAL/PLATELET - Abnormal; Notable for the following components:      Result Value   MCH 34.5 (*)    Platelets 115 (*)    All other components within normal limits  BASIC METABOLIC PANEL - Abnormal; Notable for the following components:   Sodium 134 (*)    Chloride 93 (*)    Glucose, Bld 147 (*)    Calcium 10.4 (*)    All other components within normal limits  URINALYSIS, ROUTINE W REFLEX MICROSCOPIC - Abnormal; Notable for the following components:   Bilirubin Urine SMALL (*)  Ketones, ur 15 (*)    Protein, ur 100 (*)    All other components within normal limits  RAPID URINE DRUG SCREEN, HOSP PERFORMED - Abnormal; Notable for the following components:   Tetrahydrocannabinol POSITIVE (*)    All other components within normal limits  URINALYSIS, MICROSCOPIC (REFLEX) - Abnormal; Notable for the following components:   Bacteria, UA RARE (*)    All other components within normal limits  ETHANOL    EKG  EKG Interpretation  Date/Time:  Monday December 05 2017 07:46:28 EST Ventricular Rate:  90 PR Interval:    QRS Duration: 72 QT Interval:  382 QTC Calculation: 468 R Axis:   65 Text Interpretation:  Sinus rhythm Atrial premature complex Left  atrial enlargement Left ventricular hypertrophy Anterior infarct, old Confirmed by Donnetta Hutchingook, Tyde Lamison ([id number]) on 12/05/2017 9:19:34 AM       Radiology Ct Head Wo Contrast  Result Date: 12/05/2017 CLINICAL DATA:  Apparent seizure with syncope EXAM: CT HEAD WITHOUT CONTRAST TECHNIQUE: Contiguous axial images were obtained from the base of the skull through the vertex without intravenous contrast. COMPARISON:  None. FINDINGS: Brain: The ventricles are normal in size and configuration. There is no intracranial mass, hemorrhage, extra-axial fluid collection, or midline shift. Gray-white compartments are normal. No evident acute infarct. Vascular: No hyperdense vessel. No vascular calcifications are evident. Skull: The bony calvarium appears intact. Sinuses/Orbits: There is opacification throughout portions of each sphenoid sinus with likely retention cysts bilaterally in the sphenoid regions. There is mucosal thickening in several ethmoid air cells. Other visualized paranasal sinuses are clear. Orbits appear symmetric bilaterally. Other: Mastoid air cells are clear. IMPRESSION: Areas of paranasal sinus disease. No intracranial mass or hemorrhage. Gray-white compartments are normal. Electronically Signed   By: Bretta BangWilliam  Woodruff III M.D.   On: 12/05/2017 09:08    Procedures Procedures (including critical care time)  Medications Ordered in ED Medications  sodium chloride 0.9 % bolus 1,000 mL (0 mLs Intravenous Stopped 12/05/17 0953)  cloNIDine (CATAPRES) tablet 0.2 mg (0.2 mg Oral Given 12/05/17 0853)     Initial Impression / Assessment and Plan / ED Course  I have reviewed the triage vital signs and the nursing notes.  Pertinent labs & imaging results that were available during my care of the patient were reviewed by me and considered in my medical decision making (see chart for details).     Patient presents with questionable syncope versus seizure. Blood pressure is very elevated.  No obvious  neurological deficits.  CT head negative.  He responded well to clonidine orally.  Encouraged to stop alcohol and street drugs.  Will start antihypertensive medication..... Hydrochlorothiazide 25 mg daily.  Encouraged to create a blood pressure log and follow-up at the free clinic.  These findings were discussed with the patient, his girlfriend, his mother.  Final Clinical Impressions(s) / ED Diagnoses   Final diagnoses:  Syncope, unspecified syncope type  Hypertension, unspecified type    ED Discharge Orders        Ordered    hydrochlorothiazide (HYDRODIURIL) 25 MG tablet  Daily     12/05/17 1301       Donnetta Hutchingook, Lorayne Getchell, MD 12/05/17 1318    Donnetta Hutchingook, Darci Lykins, MD 12/05/17 1319

## 2018-05-04 ENCOUNTER — Emergency Department (HOSPITAL_COMMUNITY)
Admission: EM | Admit: 2018-05-04 | Discharge: 2018-05-05 | Disposition: A | Discharge status: Home / Self Care | Payer: Self-pay | Attending: Emergency Medicine | Admitting: Emergency Medicine

## 2018-05-04 ENCOUNTER — Encounter (HOSPITAL_COMMUNITY): Payer: Self-pay

## 2018-05-04 ENCOUNTER — Other Ambulatory Visit: Payer: Self-pay

## 2018-05-04 ENCOUNTER — Emergency Department (HOSPITAL_COMMUNITY): Payer: Self-pay

## 2018-05-04 DIAGNOSIS — I10 Essential (primary) hypertension: Secondary | ICD-10-CM | POA: Insufficient documentation

## 2018-05-04 DIAGNOSIS — F17200 Nicotine dependence, unspecified, uncomplicated: Secondary | ICD-10-CM | POA: Insufficient documentation

## 2018-05-04 DIAGNOSIS — Z79899 Other long term (current) drug therapy: Secondary | ICD-10-CM | POA: Insufficient documentation

## 2018-05-04 DIAGNOSIS — R569 Unspecified convulsions: Secondary | ICD-10-CM | POA: Insufficient documentation

## 2018-05-04 LAB — BLOOD GAS, VENOUS
ACID-BASE EXCESS: 0.2 mmol/L (ref 0.0–2.0)
Bicarbonate: 24.5 mmol/L (ref 20.0–28.0)
Drawn by: 4252
FIO2: 21
O2 Saturation: 97 %
PH VEN: 7.39 (ref 7.250–7.430)
Patient temperature: 37
pCO2, Ven: 41.5 mmHg — ABNORMAL LOW (ref 44.0–60.0)
pO2, Ven: 104 mmHg — ABNORMAL HIGH (ref 32.0–45.0)

## 2018-05-04 LAB — CBC WITH DIFFERENTIAL/PLATELET
BASOS ABS: 0 10*3/uL (ref 0.0–0.1)
Basophils Relative: 0 %
EOS PCT: 0 %
Eosinophils Absolute: 0 10*3/uL (ref 0.0–0.7)
HCT: 42.3 % (ref 39.0–52.0)
Hemoglobin: 14.7 g/dL (ref 13.0–17.0)
LYMPHS PCT: 5 %
Lymphs Abs: 0.3 10*3/uL — ABNORMAL LOW (ref 0.7–4.0)
MCH: 34.2 pg — ABNORMAL HIGH (ref 26.0–34.0)
MCHC: 34.8 g/dL (ref 30.0–36.0)
MCV: 98.4 fL (ref 78.0–100.0)
Monocytes Absolute: 0.9 10*3/uL (ref 0.1–1.0)
Monocytes Relative: 14 %
NEUTROS ABS: 4.9 10*3/uL (ref 1.7–7.7)
Neutrophils Relative %: 81 %
PLATELETS: 100 10*3/uL — AB (ref 150–400)
RBC: 4.3 MIL/uL (ref 4.22–5.81)
RDW: 13.7 % (ref 11.5–15.5)
WBC: 6.1 10*3/uL (ref 4.0–10.5)

## 2018-05-04 LAB — COMPREHENSIVE METABOLIC PANEL
ALT: 65 U/L — ABNORMAL HIGH (ref 17–63)
ANION GAP: 14 (ref 5–15)
AST: 208 U/L — ABNORMAL HIGH (ref 15–41)
Albumin: 4.1 g/dL (ref 3.5–5.0)
Alkaline Phosphatase: 117 U/L (ref 38–126)
BILIRUBIN TOTAL: 1 mg/dL (ref 0.3–1.2)
BUN: 7 mg/dL (ref 6–20)
CO2: 24 mmol/L (ref 22–32)
Calcium: 9.9 mg/dL (ref 8.9–10.3)
Chloride: 98 mmol/L — ABNORMAL LOW (ref 101–111)
Creatinine, Ser: 0.74 mg/dL (ref 0.61–1.24)
Glucose, Bld: 119 mg/dL — ABNORMAL HIGH (ref 65–99)
POTASSIUM: 4.2 mmol/L (ref 3.5–5.1)
Sodium: 136 mmol/L (ref 135–145)
TOTAL PROTEIN: 8.6 g/dL — AB (ref 6.5–8.1)

## 2018-05-04 LAB — ETHANOL: Alcohol, Ethyl (B): <10 mg/dL (ref <10)

## 2018-05-04 MED ORDER — LEVETIRACETAM 500 MG PO TABS: 500.0000 mg | ORAL_TABLET | Freq: Two times a day (BID) | ORAL | 3 refills | Status: AC

## 2018-05-04 MED ORDER — SODIUM CHLORIDE 0.9 % IV BOLUS
1000.0000 mL | Freq: Once | INTRAVENOUS | Status: AC
  Administered 2018-05-04: 1000 mL via INTRAVENOUS

## 2018-05-04 MED ORDER — AMLODIPINE BESYLATE 5 MG PO TABS
5.0000 mg | ORAL_TABLET | Freq: Once | ORAL | Status: AC
  Administered 2018-05-04: 5 mg via ORAL
  Filled 2018-05-04: qty 1

## 2018-05-04 MED ORDER — LEVETIRACETAM IN NACL 1000 MG/100ML IV SOLN
1000.0000 mg | Freq: Once | INTRAVENOUS | Status: AC
  Administered 2018-05-04: 1000 mg via INTRAVENOUS
  Filled 2018-05-04: qty 100

## 2018-05-04 MED ORDER — AMLODIPINE BESYLATE 5 MG PO TABS: 5.0000 mg | ORAL_TABLET | Freq: Every day | ORAL | 3 refills | Status: AC

## 2018-05-04 NOTE — ED Notes (Signed)
Patient transported to CT 

## 2018-05-04 NOTE — ED Triage Notes (Signed)
Pt in by [facility] after having a seizure at home witnessed by his fiance.  Per fiance, seizure lasted approx 3 minutes and was "full body"  This is the second time that patient has had a seizure, but has never been diagnosed and is not on any seizure meds.  Pt is awake and alert, oriented only to person and place on arrival to the e.d.

## 2018-05-04 NOTE — Discharge Instructions (Addendum)
You appear to have a seizure, and you will need follow-up care by a neurologist as soon as possible.  Call Dr. Gerilyn Pilgrimoonquah for an appointment.  Your blood pressure is high again, and requires treatment.  Follow-up with a primary care doctor as soon as possible for further care and treatment.  You can use the attached resource guide to help you find a doctor.

## 2018-05-04 NOTE — ED Provider Notes (Signed)
[facility] EMERGENCY DEPARTMENT Provider Note   CSN: [id number] Arrival date & time: 05/04/18  1927     History   Chief Complaint Chief Complaint  Patient presents with  . Seizures    HPI Jeremy Berger is a 37 y.o. male.  HPI   The patient was with his fiance about 90 minutes ago when he had 3 back-to-back seizures each about 45 seconds, characterized by shaking arms and legs.  He was briefly postictal, after each seizure.  After the third seizure he awoke quickly and was confused about who his fiance was.  He also fell earlier today, but no one was with him.  He has a history of a seizure with syncope, January 2018 but did not have treatment started for seizure or get any outpatient evaluation done.  There is been no recent fever, chills, vomiting, dizziness.  He does not smoke cigarettes, drink alcohol or take drugs.  There are no other no modifying factors.  Past Medical History:  Diagnosis Date  . Hypertension     There are no active problems to display for this patient.   History reviewed. No pertinent surgical history.      Home Medications    Prior to Admission medications   Medication Sig Start Date End Date Taking? Authorizing Provider  amLODipine (NORVASC) 5 MG tablet Take 1 tablet (5 mg total) by mouth daily. 05/04/18   Mancel Bale, MD  hydrochlorothiazide (HYDRODIURIL) 25 MG tablet Take 1 tablet (25 mg total) by mouth daily. 12/05/17   Donnetta Hutching, MD  levETIRAcetam (KEPPRA) 500 MG tablet Take 1 tablet (500 mg total) by mouth 2 (two) times daily. 05/04/18   Mancel Bale, MD  naproxen (NAPROSYN) 250 MG tablet Take 250 mg by mouth 2 (two) times daily as needed for mild pain.    [provider]    Family History No family history on file.  Social History Social History   Tobacco Use  . Smoking status: Current Every Day Smoker  . Smokeless tobacco: Never Used  Substance Use Topics  . Alcohol use: Yes    Comment: daily  . Drug use: Yes   Types: Marijuana     Allergies   Patient has no known allergies.   Review of Systems Review of Systems  All other systems reviewed and are negative.    Physical Exam Updated Vital Signs BP (!) 184/115   Pulse 88   Temp 98.6 F (37 C) (Oral)   Resp 14   Ht 6\' 2"  (1.88 m)   Wt 72.6 kg (160 lb)   SpO2 100%   BMI 20.54 kg/m   Physical Exam  Constitutional: He is oriented to person, place, and time. He appears well-developed and well-nourished.  HENT:  Head: Normocephalic.  Right Ear: External ear normal.  Left Ear: External ear normal.  Tongue abrasion right side, consistent with trauma during seizure.  Eyes: Pupils are equal, round, and reactive to light. Conjunctivae and EOM are normal.  Neck: Normal range of motion and phonation normal. Neck supple.  Cardiovascular: Normal rate, regular rhythm and normal heart sounds.  Pulmonary/Chest: Effort normal and breath sounds normal. He exhibits no bony tenderness.  Abdominal: Soft. There is no tenderness.  Musculoskeletal: Normal range of motion. He exhibits no edema.  Normal range of motion arms and legs.  Neurological: He is alert and oriented to person, place, and time. No cranial nerve deficit or sensory deficit. He exhibits normal muscle tone. Coordination normal.  No dysarthria, aphasia  or nystagmus.  Skin: Skin is warm, dry and intact.  Psychiatric: He has a normal mood and affect. His behavior is normal. Judgment and thought content normal.  Nursing note and vitals reviewed.    ED Treatments / Results  Labs (all labs ordered are listed, but only abnormal results are displayed) Labs Reviewed  COMPREHENSIVE METABOLIC PANEL - Abnormal; Notable for the following components:      Result Value   Chloride 98 (*)    Glucose, Bld 119 (*)    Total Protein 8.6 (*)    AST 208 (*)    ALT 65 (*)    All other components within normal limits  BLOOD GAS, VENOUS - Abnormal; Notable for the following components:   pCO2, Ven  41.5 (*)    pO2, Ven 104.0 (*)    All other components within normal limits  CBC WITH DIFFERENTIAL/PLATELET - Abnormal; Notable for the following components:   MCH 34.2 (*)    Platelets 100 (*)    Lymphs Abs 0.3 (*)    All other components within normal limits  ETHANOL  URINALYSIS, ROUTINE W REFLEX MICROSCOPIC  RAPID URINE DRUG SCREEN, HOSP PERFORMED    EKG EKG Interpretation  Date/Time:  Thursday May 04 2018 20:13:36 EDT Ventricular Rate:  94 PR Interval:    QRS Duration: 94 QT Interval:  394 QTC Calculation: 493 R Axis:   67 Text Interpretation:  Sinus rhythm Probable left atrial enlargement Probable left ventricular hypertrophy ST elev, probable normal early repol pattern Borderline prolonged QT interval since last tracing no significant change Confirmed by Mancel BaleWentz, Lyfe Reihl [id number]) on 05/04/2018 10:07:36 PM   Radiology Ct Head Wo Contrast  Result Date: 05/04/2018 CLINICAL DATA:  Seizure, new, nontraumatic, 18-40 yrs. EXAM: CT HEAD WITHOUT CONTRAST TECHNIQUE: Contiguous axial images were obtained from the base of the skull through the vertex without intravenous contrast. COMPARISON:  Head CT 12/05/2017 FINDINGS: Brain: No intracranial hemorrhage, mass effect, or midline shift. No hydrocephalus. The basilar cisterns are patent. No evidence of territorial infarct or acute ischemia. No extra-axial or intracranial fluid collection. Vascular: No hyperdense vessel or unexpected calcification. Skull: No fracture or focal lesion. Sinuses/Orbits: Lobular mucosal thickening of the sphenoid sinuses with minimal progression from prior exam. Remaining paranasal sinuses are clear. Mastoid air cells are well-aerated. Other: None. IMPRESSION: 1. No acute findings or explanation for seizure. 2. Sphenoid sinus disease with slight progression from exam 5 months prior. Electronically Signed   By: Rubye OaksMelanie  Ehinger M.D.   On: 05/04/2018 21:06    Procedures .Critical Care Performed by: Mancel BaleWentz, [place],  MD Authorized by: Mancel BaleWentz, Jadeyn Hargett, MD   Critical care provider statement:    Critical care time (minutes):  35   Critical care start time:  05/04/2018 8:05 PM   Critical care end time:  05/04/2018 11:52 PM   Critical care time was exclusive of:  Separately billable procedures and treating other patients   Critical care was necessary to treat or prevent imminent or life-threatening deterioration of the following conditions:  CNS failure or compromise and circulatory failure   Critical care was time spent personally by me on the following activities:  Blood draw for specimens, development of treatment plan with patient or surrogate, discussions with consultants, evaluation of patient's response to treatment, examination of patient, obtaining history from patient or surrogate, ordering and performing treatments and interventions, ordering and review of laboratory studies, pulse oximetry, re-evaluation of patient's condition, review of old charts and ordering and review of radiographic studies   (  including critical care time)  Medications Ordered in ED Medications  sodium chloride 0.9 % bolus 1,000 mL (0 mLs Intravenous Stopped 05/04/18 2339)  levETIRAcetam (KEPPRA) IVPB 1000 mg/100 mL premix (0 mg Intravenous Stopped 05/04/18 2054)  amLODipine (NORVASC) tablet 5 mg (5 mg Oral Given 05/04/18 2354)     Initial Impression / Assessment and Plan / ED Course  I have reviewed the triage vital signs and the nursing notes.  Pertinent labs & imaging results that were available during my care of the patient were reviewed by me and considered in my medical decision making (see chart for details).      Patient Vitals for the past 24 hrs:  BP Temp Temp src Pulse Resp SpO2 Height Weight  05/04/18 2330 (!) 184/115 - - 88 14 100 % - -  05/04/18 2300 (!) 164/118 - - 90 12 100 % - -  05/04/18 2230 (!) 174/118 - - 86 13 100 % - -  05/04/18 2145 (!) 187/123 - - 99 16 100 % - -  05/04/18 2130 (!) 171/111 - - 84  19 100 % - -  05/04/18 2100 (!) 182/124 - - 90 18 100 % - -  05/04/18 2030 (!) 172/120 - - 94 16 99 % - -  05/04/18 2000 (!) 171/119 - - 95 15 99 % - -  05/04/18 1935 - - - - - - 6\' 2"  (1.88 m) 72.6 kg (160 lb)  05/04/18 1933 (!) 180/124 98.6 F (37 C) Oral 98 15 99 % - -    11:34 PM Reevaluation with update and discussion. After initial assessment and treatment, an updated evaluation reveals no seizure in ED.  Evaluation is still pending because urine tests are not back. Mancel Bale   Medical Decision Making: Seizures, apparently recurrent, etiology not clear.  Patient has long-standing hypertension treated multiple times in the ED with diuretic medication.  He is apparently noncompliant with medication and does not follow-up about it.  I do not think that his high blood pressure caused his seizure today.  CRITICAL CARE-yes Performed by: Mancel Bale   Nursing Notes Reviewed/ Care Coordinated Applicable Imaging Reviewed Interpretation of Laboratory Data incorporated into ED treatment  The patient appears reasonably screened and/or stabilized for discharge and I doubt any other medical condition or other [facility] requiring further screening, evaluation, or treatment in the ED at this time prior to discharge.  Plan: Home Medications-OTC analgesia PRN; Home Treatments-rest, fluids; return here if the recommended treatment, does not improve the symptoms; Recommended follow up-PCP follow-up for blood pressure treatment as soon as possible.  Neurology follow-up for evaluation and treatment seizure disorder.    Final Clinical Impressions(s) / ED Diagnoses   Final diagnoses:  Seizures (HCC)  Hypertension, unspecified type    ED Discharge Orders        Ordered    levETIRAcetam (KEPPRA) 500 MG tablet  2 times daily     05/04/18 2354    amLODipine (NORVASC) 5 MG tablet  Daily     05/04/18 2354       Mancel Bale, MD 05/04/18 2356

## 2018-05-05 LAB — RAPID URINE DRUG SCREEN, HOSP PERFORMED
AMPHETAMINES: NOT DETECTED
BENZODIAZEPINES: NOT DETECTED
Cocaine: NOT DETECTED
Opiates: NOT DETECTED
TETRAHYDROCANNABINOL: POSITIVE — AB

## 2018-05-05 LAB — URINALYSIS, ROUTINE W REFLEX MICROSCOPIC
Bacteria, UA: NONE SEEN
Bilirubin Urine: NEGATIVE
GLUCOSE, UA: NEGATIVE mg/dL
Ketones, ur: 5 mg/dL — AB
Leukocytes, UA: NEGATIVE
NITRITE: NEGATIVE
PH: 6 (ref 5.0–8.0)
PROTEIN: 30 mg/dL — AB
SPECIFIC GRAVITY, URINE: 1.009 (ref 1.005–1.030)

## 2018-08-07 IMAGING — CT CT HEAD W/O CM
3 series · 15 of 47 positions shown, 18 images · non-contrast
Comparison: Head CT 12/05/2017

CLINICAL DATA: Seizure, new, nontraumatic, 18-40 yrs.

EXAM:
CT HEAD WITHOUT CONTRAST
TECHNIQUE: Contiguous axial images were obtained from the base of the skull
through the vertex without intravenous contrast.

[Series 2: head trauma wo · axial · 0.49mm/px · z∈[+1550,+1680]mm · 9 of 32 slices shown, 12 images]
[im 3/32  brain]
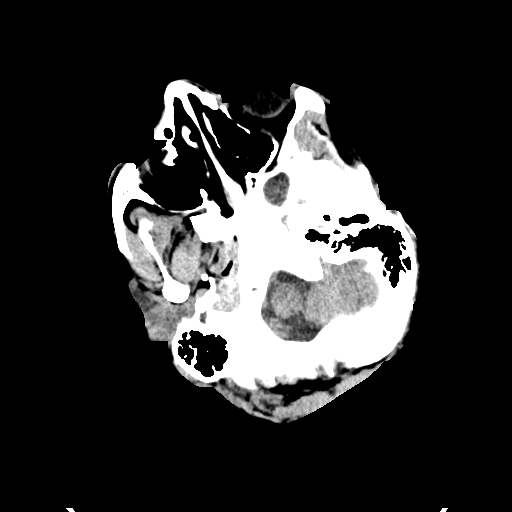
[im 3/32  bone]
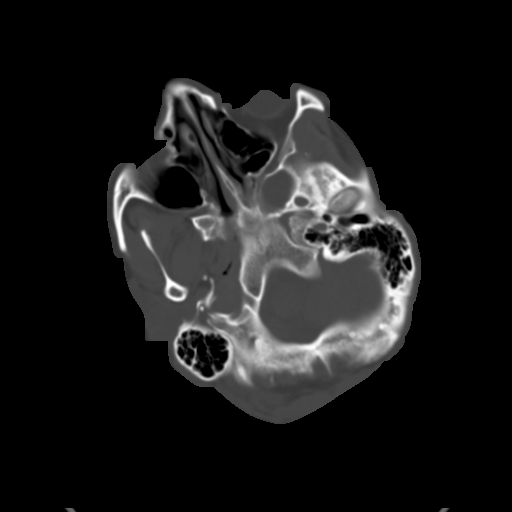
[im 6/32  brain]
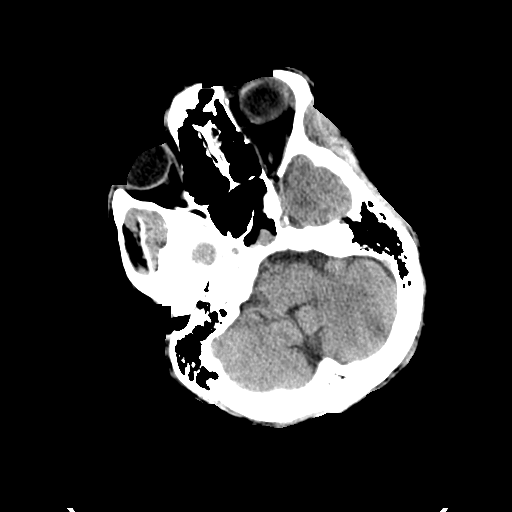
[im 9/32  brain]
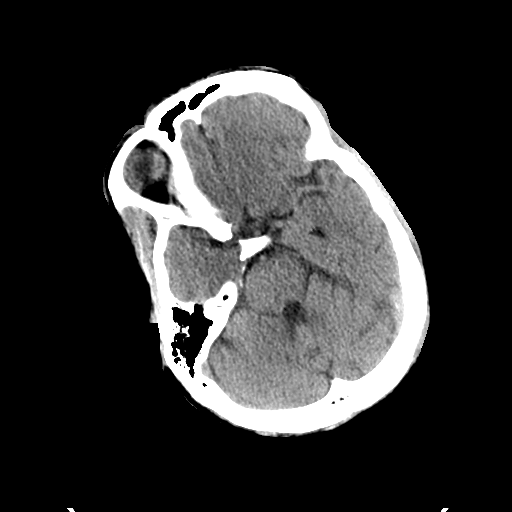
[im 12/32  brain]
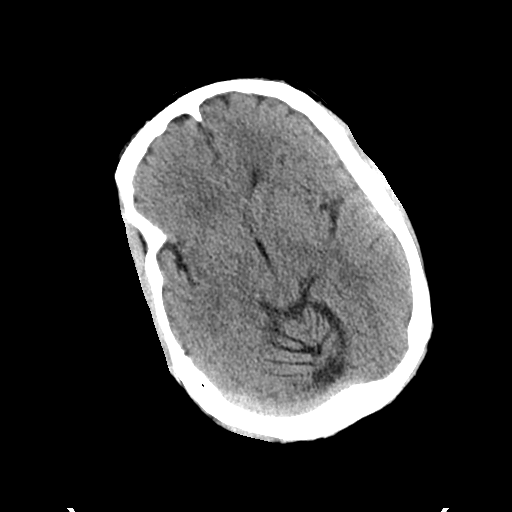
[im 17/32  brain]
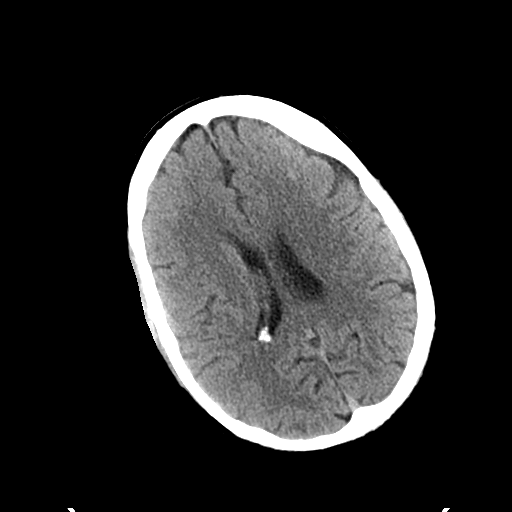
[im 17/32  bone]
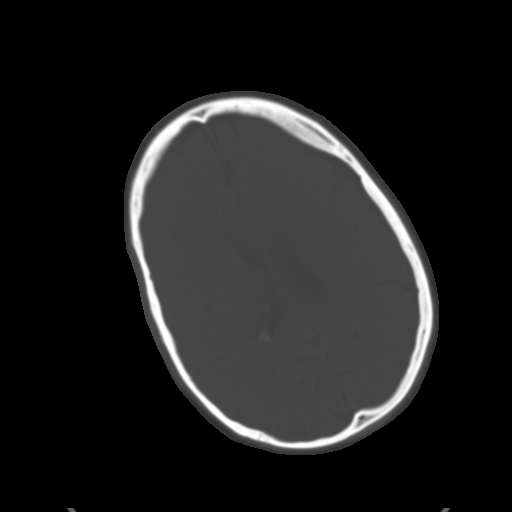
[im 20/32  brain]
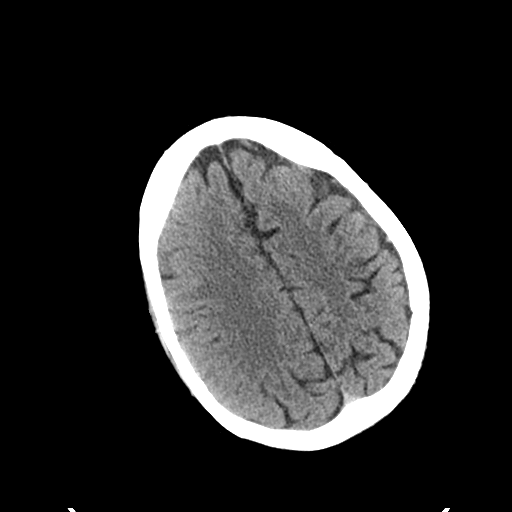
[im 23/32  brain]
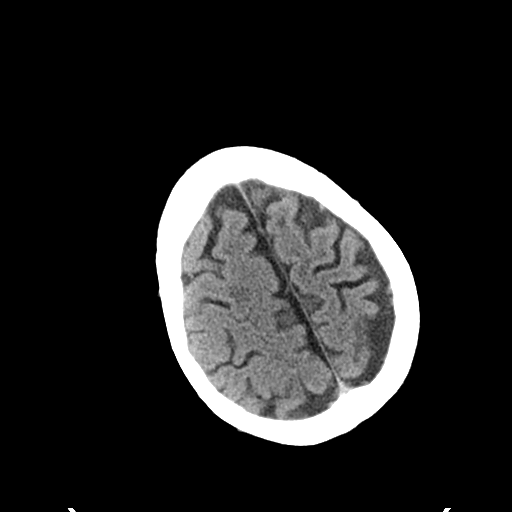
[im 26/32  brain]
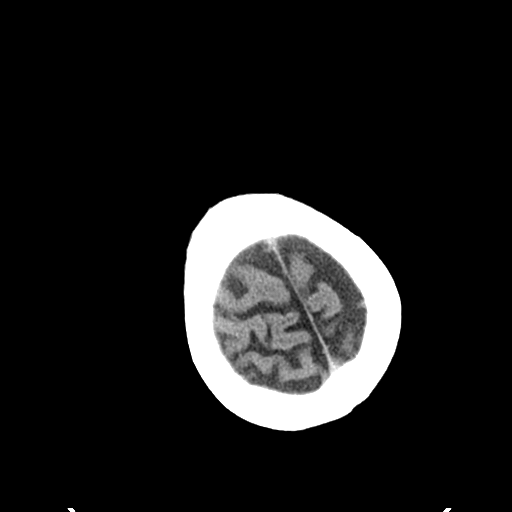
[im 29/32  brain]
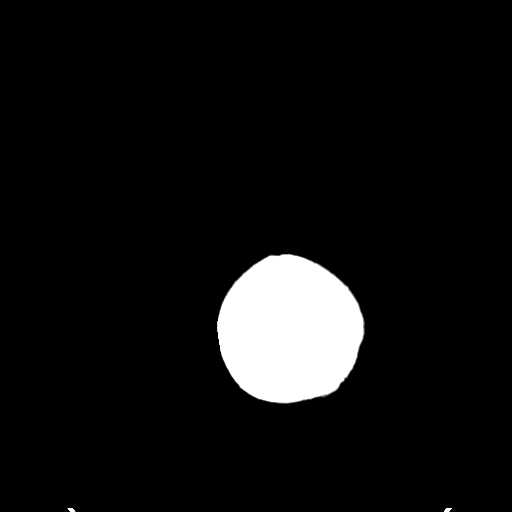
[im 29/32  bone]
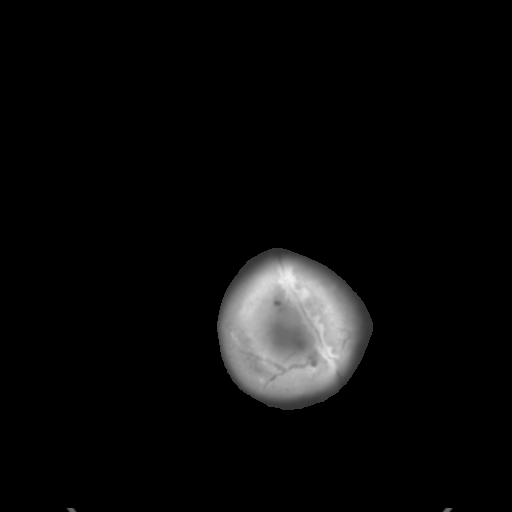

[Series 4: coronal soft tissue · coronal · 0.33mm/px · 3 of 84 slices shown]
[im 28/84  brain]
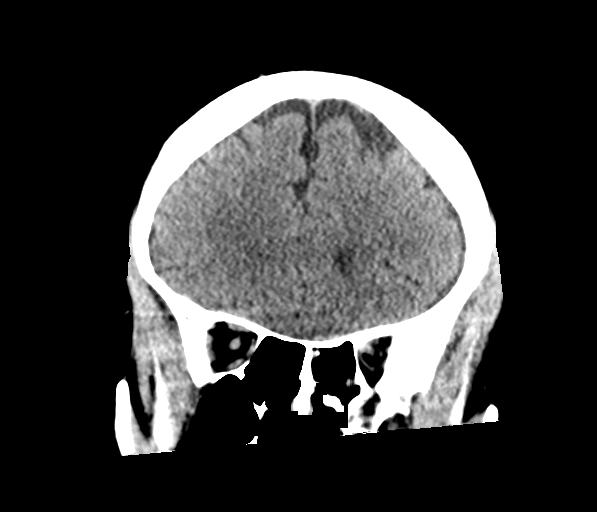
[im 37/84  brain]
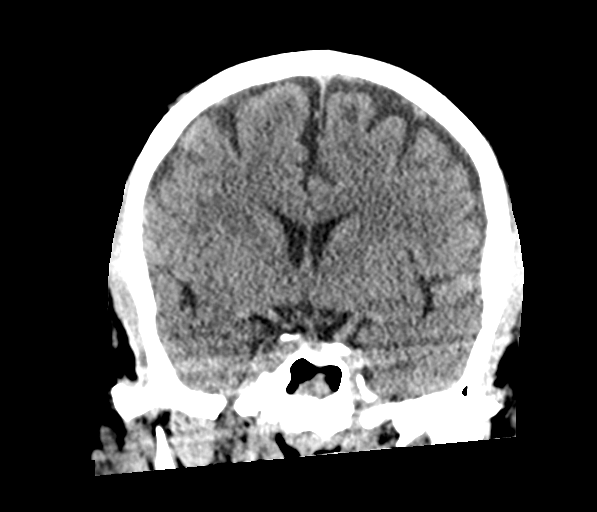
[im 47/84  brain]
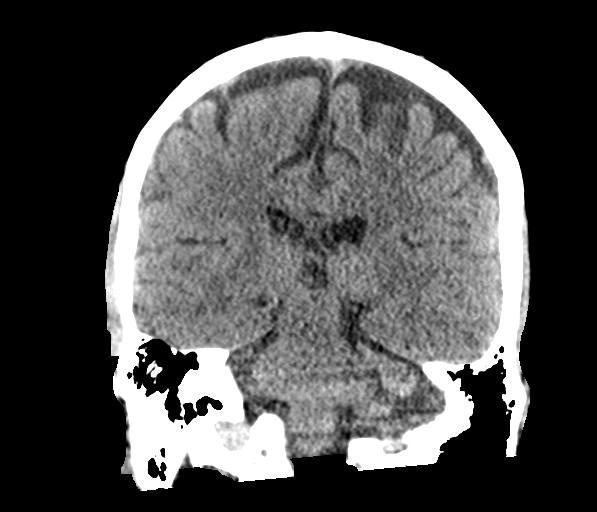

[Series 5: sagittal soft tissue · sagittal · 0.36mm/px · 3 of 62 slices shown]
[im 21/62  brain]
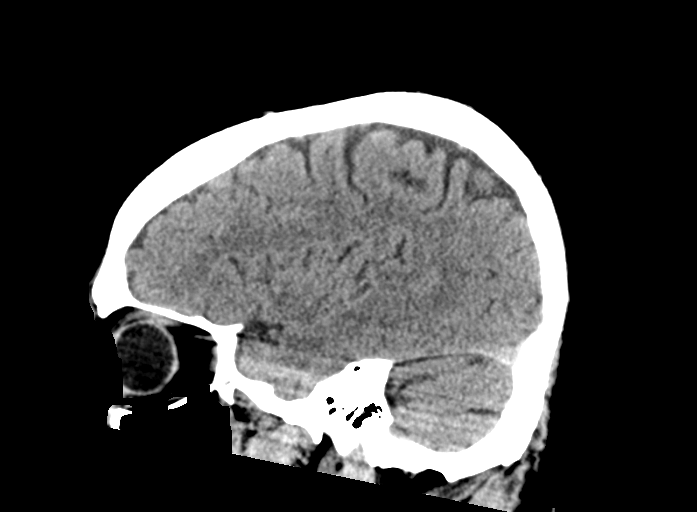
[im 31/62  brain]
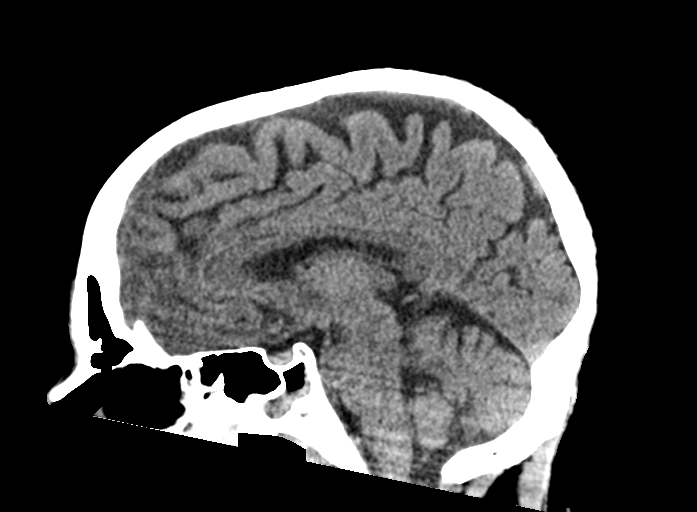
[im 41/62  brain]
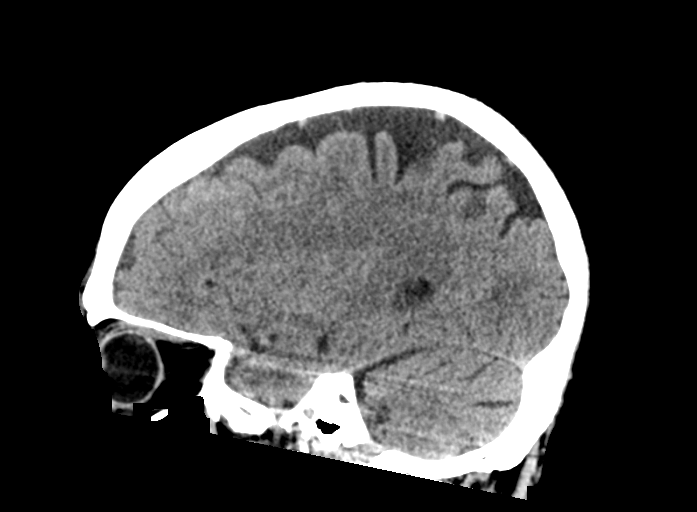

[15 of 47 positions shown; findings below may reference images not displayed]

FINDINGS: Brain: No intracranial hemorrhage, mass effect, or midline shift. No
hydrocephalus. The basilar cisterns are patent. No evidence of
territorial infarct or acute ischemia. No extra-axial or
intracranial fluid collection.

Vascular: No hyperdense vessel or unexpected calcification.

Skull: No fracture or focal lesion.

Sinuses/Orbits: Lobular mucosal thickening of the sphenoid sinuses
with minimal progression from prior exam. Remaining paranasal
sinuses are clear. Mastoid air cells are well-aerated.

Other: None.
IMPRESSION: 1. No acute findings or explanation for seizure.
2. Sphenoid sinus disease with slight progression from exam 5 months
prior.

## 2018-10-02 ENCOUNTER — Ambulatory Visit: Payer: Self-pay | Admitting: Family Medicine

## 2018-12-23 DEATH — deceased
